# Patient Record
Sex: Female | Born: 1995 | Race: White | Hispanic: No | Marital: Single | Smoking: Never smoker
Health system: Southern US, Community
[De-identification: ages and names within clinical notes are randomized; demographics above are authoritative.]

## PROBLEM LIST (undated history)

## (undated) DIAGNOSIS — Z8041 Family history of malignant neoplasm of ovary: Secondary | ICD-10-CM

## (undated) DIAGNOSIS — Z1371 Encounter for nonprocreative screening for genetic disease carrier status: Secondary | ICD-10-CM

## (undated) DIAGNOSIS — Z23 Encounter for immunization: Secondary | ICD-10-CM

## (undated) HISTORY — PX: COLONOSCOPY: SHX174

## (undated) HISTORY — PX: WISDOM TOOTH EXTRACTION: SHX21

## (undated) HISTORY — DX: Family history of malignant neoplasm of ovary: Z80.41

## (undated) HISTORY — DX: Encounter for nonprocreative screening for genetic disease carrier status: Z13.71

## (undated) HISTORY — DX: Encounter for immunization: Z23

---

## 2016-12-20 ENCOUNTER — Ambulatory Visit: Payer: Self-pay | Admitting: Obstetrics and Gynecology

## 2017-02-21 ENCOUNTER — Encounter: Payer: Self-pay | Admitting: Obstetrics and Gynecology

## 2017-02-21 ENCOUNTER — Ambulatory Visit (INDEPENDENT_AMBULATORY_CARE_PROVIDER_SITE_OTHER): Payer: 59 | Admitting: Obstetrics and Gynecology

## 2017-02-21 ENCOUNTER — Other Ambulatory Visit: Payer: Self-pay

## 2017-02-21 VITALS — BP 138/78 | HR 76 | Ht 71.0 in | Wt 309.0 lb

## 2017-02-21 DIAGNOSIS — Z01419 Encounter for gynecological examination (general) (routine) without abnormal findings: Secondary | ICD-10-CM | POA: Diagnosis not present

## 2017-02-21 DIAGNOSIS — Z124 Encounter for screening for malignant neoplasm of cervix: Secondary | ICD-10-CM | POA: Diagnosis not present

## 2017-02-21 DIAGNOSIS — Z803 Family history of malignant neoplasm of breast: Secondary | ICD-10-CM

## 2017-02-21 DIAGNOSIS — Z3041 Encounter for surveillance of contraceptive pills: Secondary | ICD-10-CM | POA: Diagnosis not present

## 2017-02-21 DIAGNOSIS — Z113 Encounter for screening for infections with a predominantly sexual mode of transmission: Secondary | ICD-10-CM | POA: Diagnosis not present

## 2017-02-21 DIAGNOSIS — Z8041 Family history of malignant neoplasm of ovary: Secondary | ICD-10-CM | POA: Diagnosis not present

## 2017-02-21 MED ORDER — NORETHIN ACE-ETH ESTRAD-FE 1-20 MG-MCG PO TABS: 1.0000 | ORAL_TABLET | Freq: Every day | ORAL | 11 refills | Status: DC

## 2017-02-21 NOTE — Patient Instructions (Signed)
I value your feedback and entrusting us with your care. If you get a [facility] patient survey, I would appreciate you taking the time to let us know about your experience today. Thank you! 

## 2017-02-21 NOTE — Progress Notes (Signed)
PCP:  Patient, No Pcp Per   Chief Complaint  Patient presents with  . Gynecologic Exam    OCP not working  . Genetic Evaluation    Myriad Testing     HPI:      [name] is a 21 y.o. No obstetric history on file. who LMP was Patient's last menstrual period was 02/14/2017., presents today for her annual examination.  Her menses are regular every 28-30 days, lasting 4 days.  Dysmenorrhea moderate, occurring first 1-2 days of flow. She does not have intermenstrual bleeding. She stopped aviane OCPs 8/18 since not sexually active. She wasn't having periods on OCPs but they resumed normally after stopping them. She would like to restart a different pill for dysmen improvement.   Sex activity: not sexually active.  Hx of STDs: none  There is a FH of breast cancer and ovarian cancer in her PGM, genetic testing hasn't been done.  The patient does do self-breast exams. Pt would like MyRisk testing today.  Tobacco use: The patient denies current or previous tobacco use. Alcohol use: none No drug use.  Exercise: moderately active  She does get adequate calcium but not Vitamin D in her diet.  Gardasil vaccine completed.   Past Medical History:  Diagnosis Date  . Vaccine for human papilloma virus (HPV) types 6, 11, 16, and 18 administered     History reviewed. No pertinent surgical history.  Family History  Problem Relation Age of Onset  . Breast cancer Paternal Grandmother 97  . Ovarian cancer Paternal Grandmother 39    Social History   Socioeconomic History  . Marital status: Single    Spouse name: Not on file  . Number of children: Not on file  . Years of education: Not on file  . Highest education level: Not on file  Social Needs  . Financial resource strain: Not on file  . Food insecurity - worry: Not on file  . Food insecurity - inability: Not on file  . Transportation needs - medical: Not on file  . Transportation needs - non-medical: Not on file    Occupational History  . Occupation: Medical illustrator  Tobacco Use  . Smoking status: Never Smoker  . Smokeless tobacco: Never Used  Substance and Sexual Activity  . Alcohol use: No    Frequency: Never  . Drug use: No  . Sexual activity: Not Currently    Birth control/protection: OCP  Other Topics Concern  . Not on file  Social History Narrative  . Not on file    No outpatient medications have been marked as taking for the 02/21/17 encounter (Office Visit) with Kadeem Hyle, Deirdre Evener, PA-C.     ROS:  Review of Systems  Constitutional: Negative for fatigue, fever and unexpected weight change.  Respiratory: Negative for cough, shortness of breath and wheezing.   Cardiovascular: Negative for chest pain, palpitations and leg swelling.  Gastrointestinal: Negative for blood in stool, constipation, diarrhea, nausea and vomiting.  Endocrine: Negative for cold intolerance, heat intolerance and polyuria.  Genitourinary: Negative for dyspareunia, dysuria, flank pain, frequency, genital sores, hematuria, menstrual problem, pelvic pain, urgency, vaginal bleeding, vaginal discharge and vaginal pain.  Musculoskeletal: Negative for back pain, joint swelling and myalgias.  Skin: Negative for rash.  Neurological: Negative for dizziness, syncope, light-headedness, numbness and headaches.  Hematological: Negative for adenopathy.  Psychiatric/Behavioral: Negative for agitation, confusion, sleep disturbance and suicidal ideas. The patient is not nervous/anxious.      Objective: BP 138/78 (BP Location:  Left Arm, Patient Position: Sitting, Cuff Size: Normal)   Pulse 76   Ht 5' 11"  (1.803 m) Comment: 71"  Wt (!) 309 lb (140.2 kg)   LMP 02/14/2017   BMI 43.10 kg/m    Physical Exam  Constitutional: She is oriented to person, place, and time. She appears well-developed and well-nourished.  Genitourinary: Vagina normal and uterus normal. There is no rash or tenderness on the right labia. There is no  rash or tenderness on the left labia. No erythema or tenderness in the vagina. No vaginal discharge found. Right adnexum does not display mass and does not display tenderness. Left adnexum does not display mass and does not display tenderness. Cervix does not exhibit motion tenderness or polyp. Uterus is not enlarged or tender.  Neck: Normal range of motion. No thyromegaly present.  Cardiovascular: Normal rate, regular rhythm and normal heart sounds.  No murmur heard. Pulmonary/Chest: Effort normal and breath sounds normal. Right breast exhibits no mass, no nipple discharge, no skin change and no tenderness. Left breast exhibits no mass, no nipple discharge, no skin change and no tenderness.  Abdominal: Soft. There is no tenderness. There is no guarding.  Musculoskeletal: Normal range of motion.  Neurological: She is alert and oriented to person, place, and time. No cranial nerve deficit.  Psychiatric: She has a normal mood and affect. Her behavior is normal.  Vitals reviewed.   Assessment/Plan: Encounter for annual routine gynecological examination  Cervical cancer screening - Plan: IGP,CtNgTv,rfx Aptima HPV ASCU  Screening for STD (sexually transmitted disease) - Plan: IGP,CtNgTv,rfx Aptima HPV ASCU  Encounter for surveillance of contraceptive pills - OCP start. Try a different pill to see if has withdrawal bleed monthly. F/u prn. Rx junel 1/20. - Plan: norethindrone-ethinyl estradiol (JUNEL FE 1/20) 1-20 MG-MCG tablet  Family history of breast cancer - Plan: VITAMIN D 25 Hydroxy (Vit-D Deficiency, Fractures), Integrated BRACAnalysis  Family history of ovarian cancer - MyRisk testing discussed and done today. Will call pt with results given age. Handout given.  - Plan: Integrated BRACAnalysis  Meds ordered this encounter  Medications  . norethindrone-ethinyl estradiol (JUNEL FE 1/20) 1-20 MG-MCG tablet    Sig: Take 1 tablet daily by mouth.    Dispense:  1 Package    Refill:  11              GYN counsel adequate intake of calcium and vitamin D, diet and exercise     F/U  Return in about 1 year (around 02/21/2018).  Diandre Merica B. Fadia Marlar, PA-C 02/21/2017 3:23 PM

## 2017-02-22 LAB — VITAMIN D 25 HYDROXY (VIT D DEFICIENCY, FRACTURES): Vit D, 25-Hydroxy: 30.1 ng/mL (ref 30.0–100.0)

## 2017-02-23 LAB — IGP,CTNGTV,RFX APTIMA HPV ASCU
Chlamydia, Nuc. Acid Amp: NEGATIVE
Gonococcus, Nuc. Acid Amp: NEGATIVE
PAP Smear Comment: 0
Trich vag by NAA: NEGATIVE

## 2017-03-05 DIAGNOSIS — Z1371 Encounter for nonprocreative screening for genetic disease carrier status: Secondary | ICD-10-CM

## 2017-03-05 HISTORY — DX: Encounter for nonprocreative screening for genetic disease carrier status: Z13.71

## 2017-03-10 ENCOUNTER — Encounter: Payer: Self-pay | Admitting: Obstetrics and Gynecology

## 2017-03-23 ENCOUNTER — Telehealth: Payer: Self-pay | Admitting: Obstetrics and Gynecology

## 2017-03-23 NOTE — Telephone Encounter (Signed)
Pt aware of neg MyRisk results. Discussed them with Stefanie Libel, GC per her [employer] ins. Nothing further to do at this time. Questions answered.

## 2017-06-14 DIAGNOSIS — M5432 Sciatica, left side: Secondary | ICD-10-CM | POA: Diagnosis not present

## 2017-06-14 DIAGNOSIS — S39012A Strain of muscle, fascia and tendon of lower back, initial encounter: Secondary | ICD-10-CM | POA: Diagnosis not present

## 2017-08-16 ENCOUNTER — Telehealth: Payer: Self-pay | Admitting: Obstetrics and Gynecology

## 2017-08-16 NOTE — Telephone Encounter (Signed)
Patient is calling for results for Myriad. Insurance states they will not pay for her follow up appointment,ent and patient wishes to speak with Elmo Putt. Please advise

## 2017-08-17 NOTE — Telephone Encounter (Signed)
Pt received letter from [facility] stating it wasn't covered. I received same letter and have already spoken to Mikal Plane at [facility]. She states it was originally approved 11/18 so we're not sure why case was retroactively denied. Pt clearly meets guidelines and followed [employer] protocol of talking with GC. Leda Gauze will let me know if anything else needs to be done, but pt won't receive a bill regardless. Reassured pt. F/u prn.

## 2018-02-22 ENCOUNTER — Encounter: Payer: Self-pay | Admitting: Obstetrics and Gynecology

## 2018-02-22 ENCOUNTER — Ambulatory Visit (INDEPENDENT_AMBULATORY_CARE_PROVIDER_SITE_OTHER): Payer: Commercial Managed Care - PPO | Admitting: Obstetrics and Gynecology

## 2018-02-22 ENCOUNTER — Other Ambulatory Visit (HOSPITAL_COMMUNITY)
Admission: RE | Admit: 2018-02-22 | Discharge: 2018-02-22 | Disposition: A | Discharge status: Home / Self Care | Payer: Commercial Managed Care - PPO | Source: Ambulatory Visit | Attending: Obstetrics and Gynecology | Admitting: Obstetrics and Gynecology

## 2018-02-22 VITALS — BP 140/90 | HR 113 | Ht 71.0 in | Wt 308.0 lb

## 2018-02-22 DIAGNOSIS — Z30011 Encounter for initial prescription of contraceptive pills: Secondary | ICD-10-CM

## 2018-02-22 DIAGNOSIS — Z124 Encounter for screening for malignant neoplasm of cervix: Secondary | ICD-10-CM | POA: Insufficient documentation

## 2018-02-22 DIAGNOSIS — Z01419 Encounter for gynecological examination (general) (routine) without abnormal findings: Secondary | ICD-10-CM | POA: Diagnosis not present

## 2018-02-22 MED ORDER — DROSPIRENONE-ETHINYL ESTRADIOL 3-0.02 MG PO TABS: 1.0000 | ORAL_TABLET | Freq: Every day | ORAL | 3 refills | Status: DC

## 2018-02-22 NOTE — Patient Instructions (Signed)
I value your feedback and entrusting us with your care. If you get a Caitlin Stein patient survey, I would appreciate you taking the time to let us know about your experience today. Thank you! 

## 2018-02-22 NOTE — Progress Notes (Signed)
PCP:  Patient, No Pcp Per   Chief Complaint  Patient presents with  . Gynecologic Exam    pt states she needs to swith [facility]     HPI:      [name] is a 22 y.o. No obstetric history on file. who LMP was Patient's last menstrual period was 02/05/2018 (approximate)., presents today for her annual examination.  Her menses are regular every 28-30 days, lasting 4 days.  Dysmenorrhea moderate, occurring first 1-2 days of flow. She does not have intermenstrual bleeding. She stopped aviane OCPs 8/18 since not sexually active. She wasn't having periods on OCPs but they resumed normally after stopping them. She wanted to restart a different pill for dysmen improvement last yr. Tried junel 1/20 but had dry heaving every morning. Would like a different pill again.    Sex activity: not sexually active.  Hx of STDs: none  There is a FH of breast cancer and ovarian cancer in her PGM, genetic testing has been done. Pt is MyRisk neg 2018. IBIS=19%/riskscore =10%. The patient does do self-breast exams.   Tobacco use: The patient denies current or previous tobacco use. Alcohol use: none No drug use.  Exercise: moderately active  She does get adequate calcium but not Vitamin D in her diet.  Gardasil vaccine completed.   Past Medical History:  Diagnosis Date  . BRCA gene mutation negative 03/2017   MyRisk neg; IBIS=19%/riskscore=10%  . Family history of ovarian cancer    MyRisk neg; no further screening recommendations  . Vaccine for human papilloma virus (HPV) types 6, 11, 16, and 18 administered     History reviewed. No pertinent surgical history.  Family History  Problem Relation Age of Onset  . Breast cancer Paternal Grandmother 64  . Ovarian cancer Paternal Grandmother 23    Social History   Socioeconomic History  . Marital status: Single    Spouse name: Not on file  . Number of children: Not on file  . Years of education: Not on file  . Highest education level: Not on  file  Occupational History  . Occupation: Medical illustrator  Social Needs  . Financial resource strain: Not on file  . Food insecurity:    Worry: Not on file    Inability: Not on file  . Transportation needs:    Medical: Not on file    Non-medical: Not on file  Tobacco Use  . Smoking status: Never Smoker  . Smokeless tobacco: Never Used  Substance and Sexual Activity  . Alcohol use: No    Frequency: Never  . Drug use: No  . Sexual activity: Not Currently    Birth control/protection: None  Lifestyle  . Physical activity:    Days per week: 3 days    Minutes per session: 60 min  . Stress: Not on file  Relationships  . Social connections:    Talks on phone: More than three times a week    Gets together: More than three times a week    Attends religious service: 1 to 4 times per year    Active member of club or organization: Yes    Attends meetings of clubs or organizations: More than 4 times per year    Relationship status: Never married  . Intimate partner violence:    Fear of current or ex partner: Patient refused    Emotionally abused: Patient refused    Physically abused: Patient refused    Forced sexual activity: Patient refused  Other  Topics Concern  . Not on file  Social History Narrative  . Not on file    Current Meds  Medication Sig  . albuterol (PROAIR HFA) 108 (90 Base) MCG/ACT inhaler   . benzonatate (TESSALON) 100 MG capsule   . ipratropium (ATROVENT) 0.06 % nasal spray   . methylPREDNISolone (MEDROL DOSEPAK) 4 MG TBPK tablet   . nystatin cream (MYCOSTATIN) APPLY TO AFFECTED AREA 3 TIMES A DAY     ROS:  Review of Systems  Constitutional: Negative for fatigue, fever and unexpected weight change.  Respiratory: Negative for cough, shortness of breath and wheezing.   Cardiovascular: Negative for chest pain, palpitations and leg swelling.  Gastrointestinal: Negative for blood in stool, constipation, diarrhea, nausea and vomiting.  Endocrine: Negative for  cold intolerance, heat intolerance and polyuria.  Genitourinary: Negative for dyspareunia, dysuria, flank pain, frequency, genital sores, hematuria, menstrual problem, pelvic pain, urgency, vaginal bleeding, vaginal discharge and vaginal pain.  Musculoskeletal: Negative for back pain, joint swelling and myalgias.  Skin: Negative for rash.  Neurological: Negative for dizziness, syncope, light-headedness, numbness and headaches.  Hematological: Negative for adenopathy.  Psychiatric/Behavioral: Negative for agitation, confusion, sleep disturbance and suicidal ideas. The patient is not nervous/anxious.      Objective: BP 140/90   Pulse (!) 113   Ht 5' 11"  (1.803 m)   Wt (!) 308 lb (139.7 kg)   LMP 02/05/2018 (Approximate)   BMI 42.96 kg/m    Physical Exam  Constitutional: She is oriented to person, place, and time. She appears well-developed and well-nourished.  Genitourinary: Vagina normal and uterus normal. There is no rash or tenderness on the right labia. There is no rash or tenderness on the left labia. No erythema or tenderness in the vagina. No vaginal discharge found. Right adnexum does not display mass and does not display tenderness. Left adnexum does not display mass and does not display tenderness. Cervix does not exhibit motion tenderness or polyp. Uterus is not enlarged or tender.  Neck: Normal range of motion. No thyromegaly present.  Cardiovascular: Normal rate, regular rhythm and normal heart sounds.  No murmur heard. Pulmonary/Chest: Effort normal and breath sounds normal. Right breast exhibits no mass, no nipple discharge, no skin change and no tenderness. Left breast exhibits no mass, no nipple discharge, no skin change and no tenderness.  Abdominal: Soft. There is no tenderness. There is no guarding.  Musculoskeletal: Normal range of motion.  Neurological: She is alert and oriented to person, place, and time. No cranial nerve deficit.  Psychiatric: She has a normal mood  and affect. Her behavior is normal.  Vitals reviewed.   Assessment/Plan: Encounter for annual routine gynecological examination  Cervical cancer screening - Plan: Cytology - PAP  Encounter for initial prescription of contraceptive pills - OCP restart with next menses.  - Plan: drospirenone-ethinyl estradiol (YAZ) 3-0.02 MG tablet  Meds ordered this encounter  Medications  . drospirenone-ethinyl estradiol (YAZ) 3-0.02 MG tablet    Sig: Take 1 tablet by mouth daily.    Dispense:  3 Package    Refill:  3    Order Specific Question:   Supervising Provider    Answer:   Gae Dry [[id number]]             GYN counsel adequate intake of calcium and vitamin D, diet and exercise     F/U  Return in about 1 year (around 02/23/2019).  Laurette Villescas B. Kindred Heying, PA-C 02/22/2018 1:52 PM

## 2018-02-23 DIAGNOSIS — R509 Fever, unspecified: Secondary | ICD-10-CM | POA: Diagnosis not present

## 2018-02-23 DIAGNOSIS — R0602 Shortness of breath: Secondary | ICD-10-CM | POA: Diagnosis not present

## 2018-02-23 DIAGNOSIS — R079 Chest pain, unspecified: Secondary | ICD-10-CM | POA: Diagnosis not present

## 2018-02-23 DIAGNOSIS — R05 Cough: Secondary | ICD-10-CM | POA: Diagnosis not present

## 2018-02-24 LAB — CYTOLOGY - PAP: Diagnosis: NEGATIVE

## 2018-05-08 DIAGNOSIS — D2239 Melanocytic nevi of other parts of face: Secondary | ICD-10-CM | POA: Diagnosis not present

## 2018-05-08 DIAGNOSIS — L408 Other psoriasis: Secondary | ICD-10-CM | POA: Diagnosis not present

## 2018-05-08 DIAGNOSIS — L700 Acne vulgaris: Secondary | ICD-10-CM | POA: Diagnosis not present

## 2019-03-05 NOTE — Progress Notes (Signed)
PCP:  Patient, No Pcp Per   Chief Complaint  Patient presents with  . Gynecologic Exam    flu shot    HPI:      [name] is a 23 y.o. No obstetric history on file. who LMP was Patient's last menstrual period was 09/04/2018 (approximate)., presents today for her annual examination.  Her menses are absent with cont dosing of OCPs (Yaz), never taking placebo pills. No dysmen, BTB.  Sex activity: currently sexually active--contraception OCPs..  Last pap: 02/22/18 Results: no abnormalities Hx of STDs: none  There is a FH of breast cancer and ovarian cancer in her PGM, genetic testing has been done. Pt is MyRisk neg 2018. IBIS=19%/riskscore =10%. The patient does do self-breast exams.   Tobacco use: The patient denies current or previous tobacco use. Alcohol use: none No drug use.  Exercise: moderately active  She does get adequate calcium but not Vitamin D in her diet.  Gardasil vaccine completed.   Past Medical History:  Diagnosis Date  . BRCA gene mutation negative 03/2017   MyRisk neg; IBIS=19%/riskscore=10%  . Family history of ovarian cancer    MyRisk neg; no further screening recommendations  . Vaccine for human papilloma virus (HPV) types 6, 11, 16, and 18 administered     History reviewed. No pertinent surgical history.  Family History  Problem Relation Age of Onset  . Breast cancer Paternal Grandmother 57  . Ovarian cancer Paternal Grandmother 85    Social History   Socioeconomic History  . Marital status: Single    Spouse name: Not on file  . Number of children: Not on file  . Years of education: Not on file  . Highest education level: Not on file  Occupational History  . Occupation: Medical illustrator  Social Needs  . Financial resource strain: Not on file  . Food insecurity    Worry: Not on file    Inability: Not on file  . Transportation needs    Medical: Not on file    Non-medical: Not on file  Tobacco Use  . Smoking status: Never  Smoker  . Smokeless tobacco: Never Used  Substance and Sexual Activity  . Alcohol use: No    Frequency: Never  . Drug use: No  . Sexual activity: Yes    Birth control/protection: Pill  Lifestyle  . Physical activity    Days per week: 3 days    Minutes per session: 60 min  . Stress: Not on file  Relationships  . Social connections    Talks on phone: More than three times a week    Gets together: More than three times a week    Attends religious service: 1 to 4 times per year    Active member of club or organization: Yes    Attends meetings of clubs or organizations: More than 4 times per year    Relationship status: Never married  . Intimate partner violence    Fear of current or ex partner: Patient refused    Emotionally abused: Patient refused    Physically abused: Patient refused    Forced sexual activity: Patient refused  Other Topics Concern  . Not on file  Social History Narrative  . Not on file    Current Meds  Medication Sig  . albuterol (PROAIR HFA) 108 (90 Base) MCG/ACT inhaler   . clindamycin (CLEOCIN T) 1 % lotion Apply topically.  . clobetasol (TEMOVATE) 0.05 % external solution Apply topically.  . drospirenone-ethinyl estradiol (  YAZ) 3-0.02 MG tablet Take 1 tablet by mouth daily.  Marland Kitchen ketoconazole (NIZORAL) 2 % shampoo Apply topically.  . tretinoin (RETIN-A) 0.05 % cream Apply topically.  . [DISCONTINUED] drospirenone-ethinyl estradiol (YAZ) 3-0.02 MG tablet Take 1 tablet by mouth daily.  . [DISCONTINUED] drospirenone-ethinyl estradiol (YAZ) 3-0.02 MG tablet Take 1 tablet by mouth daily.     ROS:  Review of Systems  Constitutional: Negative for fatigue, fever and unexpected weight change.  Respiratory: Negative for cough, shortness of breath and wheezing.   Cardiovascular: Negative for chest pain, palpitations and leg swelling.  Gastrointestinal: Negative for blood in stool, constipation, diarrhea, nausea and vomiting.  Endocrine: Negative for cold  intolerance, heat intolerance and polyuria.  Genitourinary: Negative for dyspareunia, dysuria, flank pain, frequency, genital sores, hematuria, menstrual problem, pelvic pain, urgency, vaginal bleeding, vaginal discharge and vaginal pain.  Musculoskeletal: Negative for back pain, joint swelling and myalgias.  Skin: Negative for rash.  Neurological: Negative for dizziness, syncope, light-headedness, numbness and headaches.  Hematological: Negative for adenopathy.  Psychiatric/Behavioral: Negative for agitation, confusion, sleep disturbance and suicidal ideas. The patient is not nervous/anxious.      Objective: BP 118/80   Ht _0  (1.803 m)   Wt 287 lb (130.2 kg)   LMP 09/04/2018 (Approximate)   BMI 40.03 kg/m    Physical Exam Constitutional:      Appearance: She is well-developed.  Genitourinary:     Vulva, vagina, uterus, right adnexa and left adnexa normal.     No vulval lesion or tenderness noted.     No vaginal discharge, erythema or tenderness.     No cervical motion tenderness or polyp.     Uterus is not enlarged or tender.     No right or left adnexal mass present.     Right adnexa not tender.     Left adnexa not tender.  Neck:     Musculoskeletal: Normal range of motion.     Thyroid: No thyromegaly.  Cardiovascular:     Rate and Rhythm: Normal rate and regular rhythm.     Heart sounds: Normal heart sounds. No murmur.  Pulmonary:     Effort: Pulmonary effort is normal.     Breath sounds: Normal breath sounds.  Chest:     Breasts:        Right: No mass, nipple discharge, skin change or tenderness.        Left: No mass, nipple discharge, skin change or tenderness.  Abdominal:     Palpations: Abdomen is soft.     Tenderness: There is no abdominal tenderness. There is no guarding.  Musculoskeletal: Normal range of motion.  Neurological:     General: No focal deficit present.     Mental Status: She is alert and oriented to person, place, and time.     Cranial  Nerves: No cranial nerve deficit.  Skin:    General: Skin is warm and dry.  Psychiatric:        Mood and Affect: Mood normal.        Behavior: Behavior normal.        Thought Content: Thought content normal.        Judgment: Judgment normal.  Vitals signs reviewed.     Assessment/Plan: Encounter for annual routine gynecological examination  Screening for STD (sexually transmitted disease) - Plan: Avenel STD  Encounter for initial prescription of contraceptive pills - OCP RF.  - Plan: drospirenone-ethinyl estradiol (YAZ) 3-0.02 MG tablet, DISCONTINUED: drospirenone-ethinyl estradiol (YAZ) 3-0.02 MG tablet  Meds ordered this encounter  Medications  . DISCONTD: drospirenone-ethinyl estradiol (YAZ) 3-0.02 MG tablet    Sig: Take 1 tablet by mouth daily.    Dispense:  3 Package    Refill:  3    Order Specific Question:   Supervising Provider    Answer:   Gae Dry [id number]  . drospirenone-ethinyl estradiol (YAZ) 3-0.02 MG tablet    Sig: Take 1 tablet by mouth daily.    Dispense:  3 Package    Refill:  4    Order Specific Question:   Supervising Provider    Answer:   Gae Dry [[id number]]             GYN counsel adequate intake of calcium and vitamin D, diet and exercise     F/U  Return in about 1 year (around 03/05/2020).  Altagracia Rone B. Andretta Ergle, PA-C 03/06/2019 8:41 AM

## 2019-03-06 ENCOUNTER — Encounter: Payer: Self-pay | Admitting: Obstetrics and Gynecology

## 2019-03-06 ENCOUNTER — Ambulatory Visit (INDEPENDENT_AMBULATORY_CARE_PROVIDER_SITE_OTHER): Payer: 59 | Admitting: Obstetrics and Gynecology

## 2019-03-06 ENCOUNTER — Other Ambulatory Visit: Payer: Self-pay

## 2019-03-06 ENCOUNTER — Other Ambulatory Visit (HOSPITAL_COMMUNITY)
Admission: RE | Admit: 2019-03-06 | Discharge: 2019-03-06 | Disposition: A | Discharge status: Home / Self Care | Payer: 59 | Source: Ambulatory Visit | Attending: Obstetrics and Gynecology | Admitting: Obstetrics and Gynecology

## 2019-03-06 VITALS — BP 118/80 | Ht 71.0 in | Wt 287.0 lb

## 2019-03-06 DIAGNOSIS — Z23 Encounter for immunization: Secondary | ICD-10-CM | POA: Diagnosis not present

## 2019-03-06 DIAGNOSIS — Z113 Encounter for screening for infections with a predominantly sexual mode of transmission: Secondary | ICD-10-CM | POA: Diagnosis not present

## 2019-03-06 DIAGNOSIS — Z01419 Encounter for gynecological examination (general) (routine) without abnormal findings: Secondary | ICD-10-CM

## 2019-03-06 DIAGNOSIS — Z3041 Encounter for surveillance of contraceptive pills: Secondary | ICD-10-CM

## 2019-03-06 DIAGNOSIS — Z30011 Encounter for initial prescription of contraceptive pills: Secondary | ICD-10-CM

## 2019-03-06 MED ORDER — DROSPIRENONE-ETHINYL ESTRADIOL 3-0.02 MG PO TABS: 1.0000 | ORAL_TABLET | Freq: Every day | ORAL | 4 refills | Status: DC

## 2019-03-06 MED ORDER — DROSPIRENONE-ETHINYL ESTRADIOL 3-0.02 MG PO TABS: 1.0000 | ORAL_TABLET | Freq: Every day | ORAL | 3 refills | Status: DC

## 2019-03-06 NOTE — Patient Instructions (Signed)
I value your feedback and entrusting us with your care. If you get a [facility] patient survey, I would appreciate you taking the time to let us know about your experience today. Thank you! 

## 2019-03-06 NOTE — Addendum Note (Signed)
Addended by: Drenda Freeze on: 03/06/2019 09:03 AM   Modules accepted: Orders

## 2019-03-08 LAB — CERVICOVAGINAL ANCILLARY ONLY
Chlamydia: NEGATIVE
Comment: NEGATIVE
Comment: NORMAL
Neisseria Gonorrhea: NEGATIVE

## 2019-04-02 ENCOUNTER — Other Ambulatory Visit: Payer: Self-pay

## 2019-04-02 ENCOUNTER — Ambulatory Visit: Payer: 59 | Attending: Internal Medicine

## 2019-04-02 DIAGNOSIS — Z20822 Contact with and (suspected) exposure to covid-19: Secondary | ICD-10-CM

## 2019-04-03 LAB — NOVEL CORONAVIRUS, NAA: SARS-CoV-2, NAA: DETECTED — AB

## 2019-04-04 ENCOUNTER — Encounter: Payer: Self-pay | Admitting: Infectious Diseases

## 2019-04-06 ENCOUNTER — Emergency Department
Admission: EM | Admit: 2019-04-06 | Discharge: 2019-04-06 | Disposition: A | Discharge status: Home / Self Care | Payer: 59 | Attending: Emergency Medicine | Admitting: Emergency Medicine

## 2019-04-06 ENCOUNTER — Other Ambulatory Visit: Payer: Self-pay

## 2019-04-06 ENCOUNTER — Emergency Department: Payer: 59

## 2019-04-06 DIAGNOSIS — E860 Dehydration: Secondary | ICD-10-CM | POA: Diagnosis not present

## 2019-04-06 DIAGNOSIS — R0789 Other chest pain: Secondary | ICD-10-CM | POA: Diagnosis present

## 2019-04-06 DIAGNOSIS — R55 Syncope and collapse: Secondary | ICD-10-CM

## 2019-04-06 DIAGNOSIS — R531 Weakness: Secondary | ICD-10-CM | POA: Diagnosis not present

## 2019-04-06 DIAGNOSIS — U071 COVID-19: Secondary | ICD-10-CM | POA: Diagnosis not present

## 2019-04-06 LAB — CBC WITH DIFFERENTIAL/PLATELET
Abs Immature Granulocytes: 0 10*3/uL (ref 0.00–0.07)
Basophils Absolute: 0 10*3/uL (ref 0.0–0.1)
Basophils Relative: 1 %
Eosinophils Absolute: 0.1 10*3/uL (ref 0.0–0.5)
Eosinophils Relative: 1 %
HCT: 42.7 % (ref 36.0–46.0)
Hemoglobin: 14.5 g/dL (ref 12.0–15.0)
Immature Granulocytes: 0 %
Lymphocytes Relative: 53 %
Lymphs Abs: 4.1 10*3/uL — ABNORMAL HIGH (ref 0.7–4.0)
MCH: 29.7 pg (ref 26.0–34.0)
MCHC: 34 g/dL (ref 30.0–36.0)
MCV: 87.3 fL (ref 80.0–100.0)
Monocytes Absolute: 0.5 10*3/uL (ref 0.1–1.0)
Monocytes Relative: 7 %
Neutro Abs: 2.9 10*3/uL (ref 1.7–7.7)
Neutrophils Relative %: 38 %
Platelets: 270 10*3/uL (ref 150–400)
RBC: 4.89 MIL/uL (ref 3.87–5.11)
RDW: 11.8 % (ref 11.5–15.5)
WBC: 7.5 10*3/uL (ref 4.0–10.5)
nRBC: 0 % (ref 0.0–0.2)

## 2019-04-06 LAB — COMPREHENSIVE METABOLIC PANEL
ALT: 94 U/L — ABNORMAL HIGH (ref 0–44)
AST: 56 U/L — ABNORMAL HIGH (ref 15–41)
Albumin: 4.2 g/dL (ref 3.5–5.0)
Alkaline Phosphatase: 72 U/L (ref 38–126)
Anion gap: 10 (ref 5–15)
BUN: 21 mg/dL — ABNORMAL HIGH (ref 6–20)
CO2: 21 mmol/L — ABNORMAL LOW (ref 22–32)
Calcium: 9.3 mg/dL (ref 8.9–10.3)
Chloride: 107 mmol/L (ref 98–111)
Creatinine, Ser: 1.03 mg/dL — ABNORMAL HIGH (ref 0.44–1.00)
GFR calc Af Amer: >60 mL/min (ref >60)
GFR calc non Af Amer: >60 mL/min (ref >60)
Glucose, Bld: 107 mg/dL — ABNORMAL HIGH (ref 70–99)
Potassium: 3.8 mmol/L (ref 3.5–5.1)
Sodium: 138 mmol/L (ref 135–145)
Total Bilirubin: 1.1 mg/dL (ref 0.3–1.2)
Total Protein: 8.6 g/dL — ABNORMAL HIGH (ref 6.5–8.1)

## 2019-04-06 LAB — TROPONIN I (HIGH SENSITIVITY): Troponin I (High Sensitivity): <2 ng/L (ref <18)

## 2019-04-06 MED ORDER — SODIUM CHLORIDE 0.9% FLUSH
3.0000 mL | Freq: Once | INTRAVENOUS | Status: AC
  Administered 2019-04-06: 3 mL via INTRAVENOUS

## 2019-04-06 MED ORDER — SODIUM CHLORIDE 0.9 % IV SOLN
Freq: Once | INTRAVENOUS | Status: AC
  Administered 2019-04-06: 1000 mL via INTRAVENOUS

## 2019-04-06 NOTE — ED Notes (Signed)
First Nurse Note: Pt states that she has COVID. Pt is having central chest tightness and also thinks she is dehydrated. Pt is in NAD.

## 2019-04-06 NOTE — ED Provider Notes (Addendum)
[facility] Emergency Department Provider Note       Time seen: ----------------------------------------- 9:50 AM on 04/06/2019 -----------------------------------------   I have reviewed the triage vital signs and the nursing notes.  HISTORY   Chief Complaint Chest Pain    HPI Caitlin Stein is a 24 y.o. female with no known past medical history who presents to the ED for chest tightness, feeling weak and dehydrated.  Patient has felt near syncopal, does not have any palpitations.  Discomfort is 4 out of 10 in the mid chest that she describes as a tightness.  Past Medical History:  Diagnosis Date  . BRCA gene mutation negative 03/2017   MyRisk neg; IBIS=19%/riskscore=10%  . Family history of ovarian cancer    MyRisk neg; no further screening recommendations  . Vaccine for human papilloma virus (HPV) types 6, 11, 16, and 18 administered     There are no problems to display for this patient.   History reviewed. No pertinent surgical history.  Allergies Codeine  Social History Social History   Tobacco Use  . Smoking status: Never Smoker  . Smokeless tobacco: Never Used  Substance Use Topics  . Alcohol use: No  . Drug use: No   Review of Systems Constitutional: Negative for fever. Cardiovascular: Positive for chest tightness Respiratory: Negative for shortness of breath. Gastrointestinal: Negative for abdominal pain, vomiting and diarrhea. Musculoskeletal: Negative for back pain. Skin: Negative for rash. Neurological: Negative for headaches, focal weakness or numbness.  All systems negative/normal/unremarkable except as stated in the HPI  ____________________________________________   PHYSICAL EXAM:  VITAL SIGNS: ED Triage Vitals  Enc Vitals Group     BP 04/06/19 0936 139/85     Pulse Rate 04/06/19 0936 88     Resp 04/06/19 0936 17     Temp 04/06/19 0936 98.8 F (37.1 C)     Temp Source 04/06/19 0936 Oral     SpO2 04/06/19  0936 97 %     Weight 04/06/19 0932 280 lb (127 kg)     Height 04/06/19 0932 5' 11"  (1.803 m)     Head Circumference --      Peak Flow --      Pain Score 04/06/19 0932 4     Pain Loc --      Pain Edu? --      Excl. in [place]? --    Constitutional: Alert and oriented. Well appearing and in no distress. Eyes: Conjunctivae are normal. Normal extraocular movements. ENT      Head: Normocephalic and atraumatic.      Nose: No congestion/rhinnorhea.      Mouth/Throat: Mucous membranes are moist.      Neck: No stridor. Cardiovascular: Normal rate, regular rhythm. No murmurs, rubs, or gallops. Respiratory: Normal respiratory effort without tachypnea nor retractions. Breath sounds are clear and equal bilaterally. No wheezes/rales/rhonchi. Gastrointestinal: Soft and nontender. Normal bowel sounds Musculoskeletal: Nontender with normal range of motion in extremities. No lower extremity tenderness nor edema. Neurologic:  Normal speech and language. No gross focal neurologic deficits are appreciated.  Skin:  Skin is warm, dry and intact. No rash noted. Psychiatric: Mood and affect are normal. Speech and behavior are normal.  ____________________________________________  EKG: Interpreted by me.  Sinus rhythm with first-degree AV block, rate is 110 bpm, normal QRS, normal axis, normal QT  Repeat EKG, interpreted by me, normal sinus rhythm with rate of 60 bpm, normal PR interval, normal QRS, normal QT ____________________________________________  ED COURSE:  As part of  my medical decision making, I reviewed the following data within the [place] History obtained from family if available, nursing notes, old chart and ekg, as well as notes from prior ED visits. Patient presented for near syncope, we will assess with labs and imaging as indicated at this time.   Procedures  Keundra Petrucelli was evaluated in Emergency Department on 04/06/2019 for the symptoms described in the history of  present illness. She was evaluated in the context of the global COVID-19 pandemic, which necessitated consideration that the patient might be at risk for infection with the SARS-CoV-2 virus that causes COVID-19. Institutional protocols and algorithms that pertain to the evaluation of patients at risk for COVID-19 are in a state of rapid change based on information released by regulatory bodies including the CDC and federal and state organizations. These policies and algorithms were followed during the patient's care in the ED.  ____________________________________________   LABS (pertinent positives/negatives)  Labs Reviewed  CBC WITH DIFFERENTIAL/PLATELET - Abnormal; Notable for the following components:      Result Value   Lymphs Abs 4.1 (*)    All other components within normal limits  COMPREHENSIVE METABOLIC PANEL - Abnormal; Notable for the following components:   CO2 21 (*)    Glucose, Bld 107 (*)    BUN 21 (*)    Creatinine, Ser 1.03 (*)    Total Protein 8.6 (*)    AST 56 (*)    ALT 94 (*)    All other components within normal limits  POC URINE PREG, ED  TROPONIN I (HIGH SENSITIVITY)    RADIOLOGY Images were viewed by me  Chest x-ray is unremarkable  ____________________________________________   DIFFERENTIAL DIAGNOSIS   COVID-19, dehydration, electrolyte abnormality, arrhythmia  FINAL ASSESSMENT AND PLAN  COVID-19, near syncope   Plan: The patient had presented for COVID-19 and near syncope. Patient's labs were reassuring. Patient's imaging did not reveal any acute process. Her work-up here has been negative but she was orthostatic for which she received IV fluids.  I will advise outpatient follow-up with cardiology once she is clinically not contagious.   Laurence Aly, MD    Note: This note was generated in part or whole with voice recognition software. Voice recognition is usually quite accurate but there are transcription errors that can and very often  do occur. I apologize for any typographical errors that were not detected and corrected.     Earleen Newport, MD 04/06/19 1017    Earleen Newport, MD 04/06/19 1032    Earleen Newport, MD 04/06/19 1059

## 2019-04-06 NOTE — ED Triage Notes (Signed)
Pt is COVID+ (tested Wed) Pt c/o chest tightness, feeling of passing out, and "dehydration"

## 2019-06-05 ENCOUNTER — Telehealth: Payer: Self-pay

## 2019-06-05 ENCOUNTER — Encounter: Payer: Self-pay | Admitting: Obstetrics and Gynecology

## 2019-06-05 NOTE — Telephone Encounter (Signed)
My chart message rcvd from patient stating PA needed for OCP. My chart msg fwd to [facility] CMA) to complete PA.

## 2019-06-05 NOTE — Telephone Encounter (Signed)
Patient requesting return call to discuss a rx that was denied by her insurance. [phone]

## 2019-06-05 NOTE — Telephone Encounter (Signed)
Pls call her pharm to see about PA. Thx.

## 2019-06-05 NOTE — Telephone Encounter (Signed)
Spoke w/patient. She states that her birth control is no longer covered. Advised to contact insurance or look on app/website to find out what is covered and contact us back either by phone or thru my chart.

## 2019-06-06 NOTE — Telephone Encounter (Signed)
Call phamacy, no PA needed. BC is covered by her insurance she just needed to pay a copay of $68 for a 3 mo supply. Or do the goodrx coupon and pay $30 for a 3 mo supply. Pt went ahead and used goodrx coupon yesterday and paid $30 but would like for a BC completely covered by insurance. Can you change BC?

## 2019-06-06 NOTE — Telephone Encounter (Signed)
She needs to check her insurance formulary to find out what is covered at $0. She also has not done well on several pills in past.

## 2019-06-06 NOTE — Telephone Encounter (Signed)
Called pt, left detailed msg.

## 2019-07-16 ENCOUNTER — Ambulatory Visit
Admission: RE | Admit: 2019-07-16 | Discharge: 2019-07-16 | Disposition: A | Discharge status: Home / Self Care | Payer: 59 | Source: Ambulatory Visit | Attending: Gastroenterology | Admitting: Gastroenterology

## 2019-07-16 ENCOUNTER — Other Ambulatory Visit: Payer: Self-pay | Admitting: Gastroenterology

## 2019-07-16 DIAGNOSIS — K5900 Constipation, unspecified: Secondary | ICD-10-CM | POA: Insufficient documentation

## 2019-08-28 ENCOUNTER — Other Ambulatory Visit: Payer: Self-pay | Admitting: Gastroenterology

## 2019-08-28 DIAGNOSIS — R1084 Generalized abdominal pain: Secondary | ICD-10-CM

## 2019-09-06 ENCOUNTER — Ambulatory Visit: Admission: RE | Admit: 2019-09-06 | Payer: 59 | Source: Ambulatory Visit

## 2020-03-06 ENCOUNTER — Other Ambulatory Visit (HOSPITAL_COMMUNITY)
Admission: RE | Admit: 2020-03-06 | Discharge: 2020-03-06 | Disposition: A | Discharge status: Home / Self Care | Payer: 59 | Source: Ambulatory Visit | Attending: Obstetrics and Gynecology | Admitting: Obstetrics and Gynecology

## 2020-03-06 ENCOUNTER — Ambulatory Visit (INDEPENDENT_AMBULATORY_CARE_PROVIDER_SITE_OTHER): Payer: 59 | Admitting: Obstetrics and Gynecology

## 2020-03-06 ENCOUNTER — Encounter: Payer: Self-pay | Admitting: Obstetrics and Gynecology

## 2020-03-06 ENCOUNTER — Other Ambulatory Visit: Payer: Self-pay

## 2020-03-06 VITALS — BP 132/80 | HR 64 | Resp 16 | Ht 71.0 in | Wt 286.4 lb

## 2020-03-06 DIAGNOSIS — Z113 Encounter for screening for infections with a predominantly sexual mode of transmission: Secondary | ICD-10-CM

## 2020-03-06 DIAGNOSIS — Z01419 Encounter for gynecological examination (general) (routine) without abnormal findings: Secondary | ICD-10-CM | POA: Diagnosis not present

## 2020-03-06 DIAGNOSIS — Z3041 Encounter for surveillance of contraceptive pills: Secondary | ICD-10-CM | POA: Diagnosis not present

## 2020-03-06 MED ORDER — DROSPIRENONE-ETHINYL ESTRADIOL 3-0.02 MG PO TABS: 1.0000 | ORAL_TABLET | Freq: Every day | ORAL | 4 refills | Status: DC

## 2020-03-06 NOTE — Patient Instructions (Signed)
I value your feedback and entrusting us with your care. If you get a [facility] patient survey, I would appreciate you taking the time to let us know about your experience today. Thank you!  As of March 15, 2019, your lab results will be released to your MyChart immediately, before I even have a chance to see them. Please give me time to review them and contact you if there are any abnormalities. Thank you for your patience.  

## 2020-03-06 NOTE — Progress Notes (Signed)
PCP:  Rusty Aus, MD   Chief Complaint  Patient presents with  . Gynecologic Exam    HPI:      [name] is a 24 y.o. No obstetric history on file. who LMP was No LMP recorded. (Menstrual status: Oral contraceptives)., presents today for her annual examination.  Her menses are absent with cont dosing of OCPs (Yaz), never taking placebo pills. No dysmen, BTB.  Sex activity: currently sexually active--contraception OCPs..  Last pap: 02/22/18 Results: no abnormalities Hx of STDs: none  There is a FH of breast cancer and ovarian cancer in her PGM, genetic testing has been done. Pt is MyRisk neg 2018. IBIS=19%/riskscore =10%. The patient does do self-breast exams.   Tobacco use: The patient denies current or previous tobacco use. Alcohol use: none No drug use.  very active  She does get adequate calcium but not Vitamin D in her diet.  Gardasil vaccine completed.   Past Medical History:  Diagnosis Date  . BRCA gene mutation negative 03/2017   MyRisk neg; IBIS=19%/riskscore=10%  . Family history of ovarian cancer    MyRisk neg; no further screening recommendations  . Vaccine for human papilloma virus (HPV) types 6, 11, 16, and 18 administered     History reviewed. No pertinent surgical history.  Family History  Problem Relation Age of Onset  . Breast cancer Paternal Grandmother 8  . Ovarian cancer Paternal Grandmother 22    Social History   Socioeconomic History  . Marital status: Single    Spouse name: Not on file  . Number of children: Not on file  . Years of education: Not on file  . Highest education level: Not on file  Occupational History  . Occupation: Medical illustrator  Tobacco Use  . Smoking status: Never Smoker  . Smokeless tobacco: Never Used  Vaping Use  . Vaping Use: Never used  Substance and Sexual Activity  . Alcohol use: No  . Drug use: No  . Sexual activity: Yes    Birth control/protection: Pill  Other Topics Concern  . Not  on file  Social History Narrative  . Not on file   Social Determinants of Health   Financial Resource Strain:   . Difficulty of Paying Living Expenses: Not on file  Food Insecurity:   . Worried About Charity fundraiser in the Last Year: Not on file  . Ran Out of Food in the Last Year: Not on file  Transportation Needs:   . Lack of Transportation (Medical): Not on file  . Lack of Transportation (Non-Medical): Not on file  Physical Activity:   . Days of Exercise per Week: Not on file  . Minutes of Exercise per Session: Not on file  Stress:   . Feeling of Stress : Not on file  Social Connections:   . Frequency of Communication with Friends and Family: Not on file  . Frequency of Social Gatherings with Friends and Family: Not on file  . Attends Religious Services: Not on file  . Active Member of Clubs or Organizations: Not on file  . Attends Archivist Meetings: Not on file  . Marital Status: Not on file  Intimate Partner Violence:   . Fear of Current or Ex-Partner: Not on file  . Emotionally Abused: Not on file  . Physically Abused: Not on file  . Sexually Abused: Not on file    Current Meds  Medication Sig  . drospirenone-ethinyl estradiol (YAZ) 3-0.02 MG tablet Take 1  tablet by mouth daily.  Marland Kitchen ketoconazole (NIZORAL) 2 % shampoo Apply topically.  . [DISCONTINUED] drospirenone-ethinyl estradiol (YAZ) 3-0.02 MG tablet Take 1 tablet by mouth daily.     ROS:  Review of Systems  Constitutional: Negative for fatigue, fever and unexpected weight change.  Respiratory: Negative for cough, shortness of breath and wheezing.   Cardiovascular: Negative for chest pain, palpitations and leg swelling.  Gastrointestinal: Negative for blood in stool, constipation, diarrhea, nausea and vomiting.  Endocrine: Negative for cold intolerance, heat intolerance and polyuria.  Genitourinary: Negative for dyspareunia, dysuria, flank pain, frequency, genital sores, hematuria, menstrual  problem, pelvic pain, urgency, vaginal bleeding, vaginal discharge and vaginal pain.  Musculoskeletal: Negative for back pain, joint swelling and myalgias.  Skin: Negative for rash.  Neurological: Negative for dizziness, syncope, light-headedness, numbness and headaches.  Hematological: Negative for adenopathy.  Psychiatric/Behavioral: Negative for agitation, confusion, sleep disturbance and suicidal ideas. The patient is not nervous/anxious.      Objective: BP 132/80   Pulse 64   Resp 16   Ht 5' 11"  (1.803 m)   Wt 286 lb 6.4 oz (129.9 kg)   SpO2 97%   BMI 39.94 kg/m    Physical Exam Constitutional:      Appearance: She is well-developed.  Genitourinary:     Vulva, vagina, uterus, right adnexa and left adnexa normal.     No vulval lesion or tenderness noted.     No vaginal discharge, erythema or tenderness.     No cervical motion tenderness or polyp.     Uterus is not enlarged or tender.     No right or left adnexal mass present.     Right adnexa not tender.     Left adnexa not tender.  Neck:     Thyroid: No thyromegaly.  Cardiovascular:     Rate and Rhythm: Normal rate and regular rhythm.     Heart sounds: Normal heart sounds. No murmur heard.   Pulmonary:     Effort: Pulmonary effort is normal.     Breath sounds: Normal breath sounds.  Chest:     Breasts:        Right: No mass, nipple discharge, skin change or tenderness.        Left: No mass, nipple discharge, skin change or tenderness.  Abdominal:     Palpations: Abdomen is soft.     Tenderness: There is no abdominal tenderness. There is no guarding.  Musculoskeletal:        General: Normal range of motion.     Cervical back: Normal range of motion.  Neurological:     General: No focal deficit present.     Mental Status: She is alert and oriented to person, place, and time.     Cranial Nerves: No cranial nerve deficit.  Skin:    General: Skin is warm and dry.  Psychiatric:        Mood and Affect: Mood  normal.        Behavior: Behavior normal.        Thought Content: Thought content normal.        Judgment: Judgment normal.  Vitals reviewed.     Assessment/Plan: Encounter for annual routine gynecological examination  Screening for STD (sexually transmitted disease) - Plan: Cervicovaginal ancillary only  Encounter for surveillance of contraceptive pills - Plan: drospirenone-ethinyl estradiol (YAZ) 3-0.02 MG tablet    Meds ordered this encounter  Medications  . drospirenone-ethinyl estradiol (YAZ) 3-0.02 MG tablet    Sig: Take  1 tablet by mouth daily.    Dispense:  84 tablet    Refill:  4    Order Specific Question:   Supervising Provider    Answer:   Gae Dry [[id number]]             GYN counsel adequate intake of calcium and vitamin D, diet and exercise     F/U  Return in about 1 year (around 03/06/2021).  Atticus Wedin B. Kynedi Profitt, PA-C 03/06/2020 1:44 PM

## 2020-03-10 LAB — CERVICOVAGINAL ANCILLARY ONLY
Chlamydia: NEGATIVE
Comment: NEGATIVE
Comment: NORMAL
Neisseria Gonorrhea: NEGATIVE

## 2020-09-11 IMAGING — CR DG CHEST 2V
1 series · 2 of 2 positions shown · non-contrast
Comparison: None.

CLINICAL DATA: [id number] positive, chest tightness

EXAM:
CHEST - 2 VIEW

[Series 1: dg chest 2 view · 0.14mm/px · 2 of 2 slices shown]
[im 1/2]
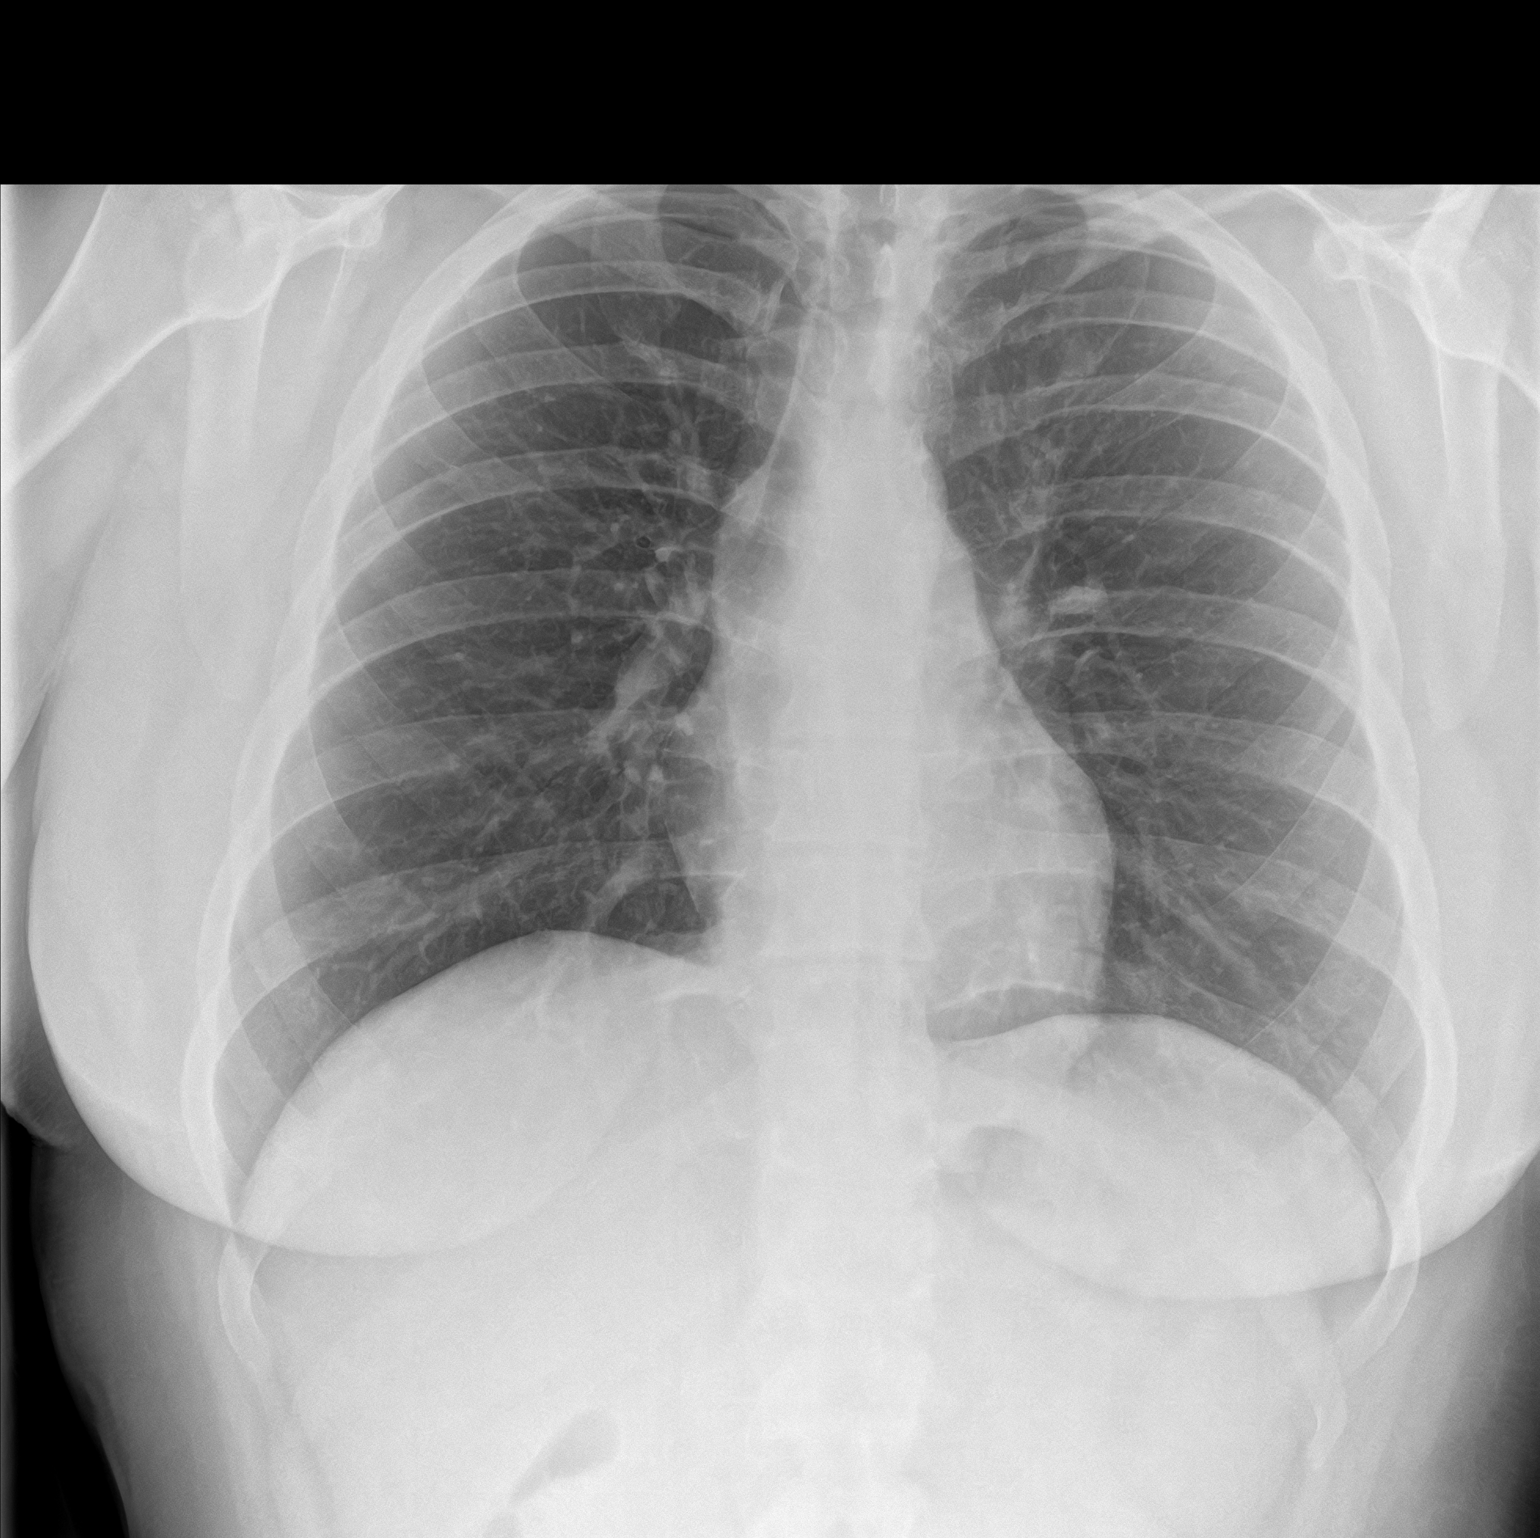
[im 2/2]
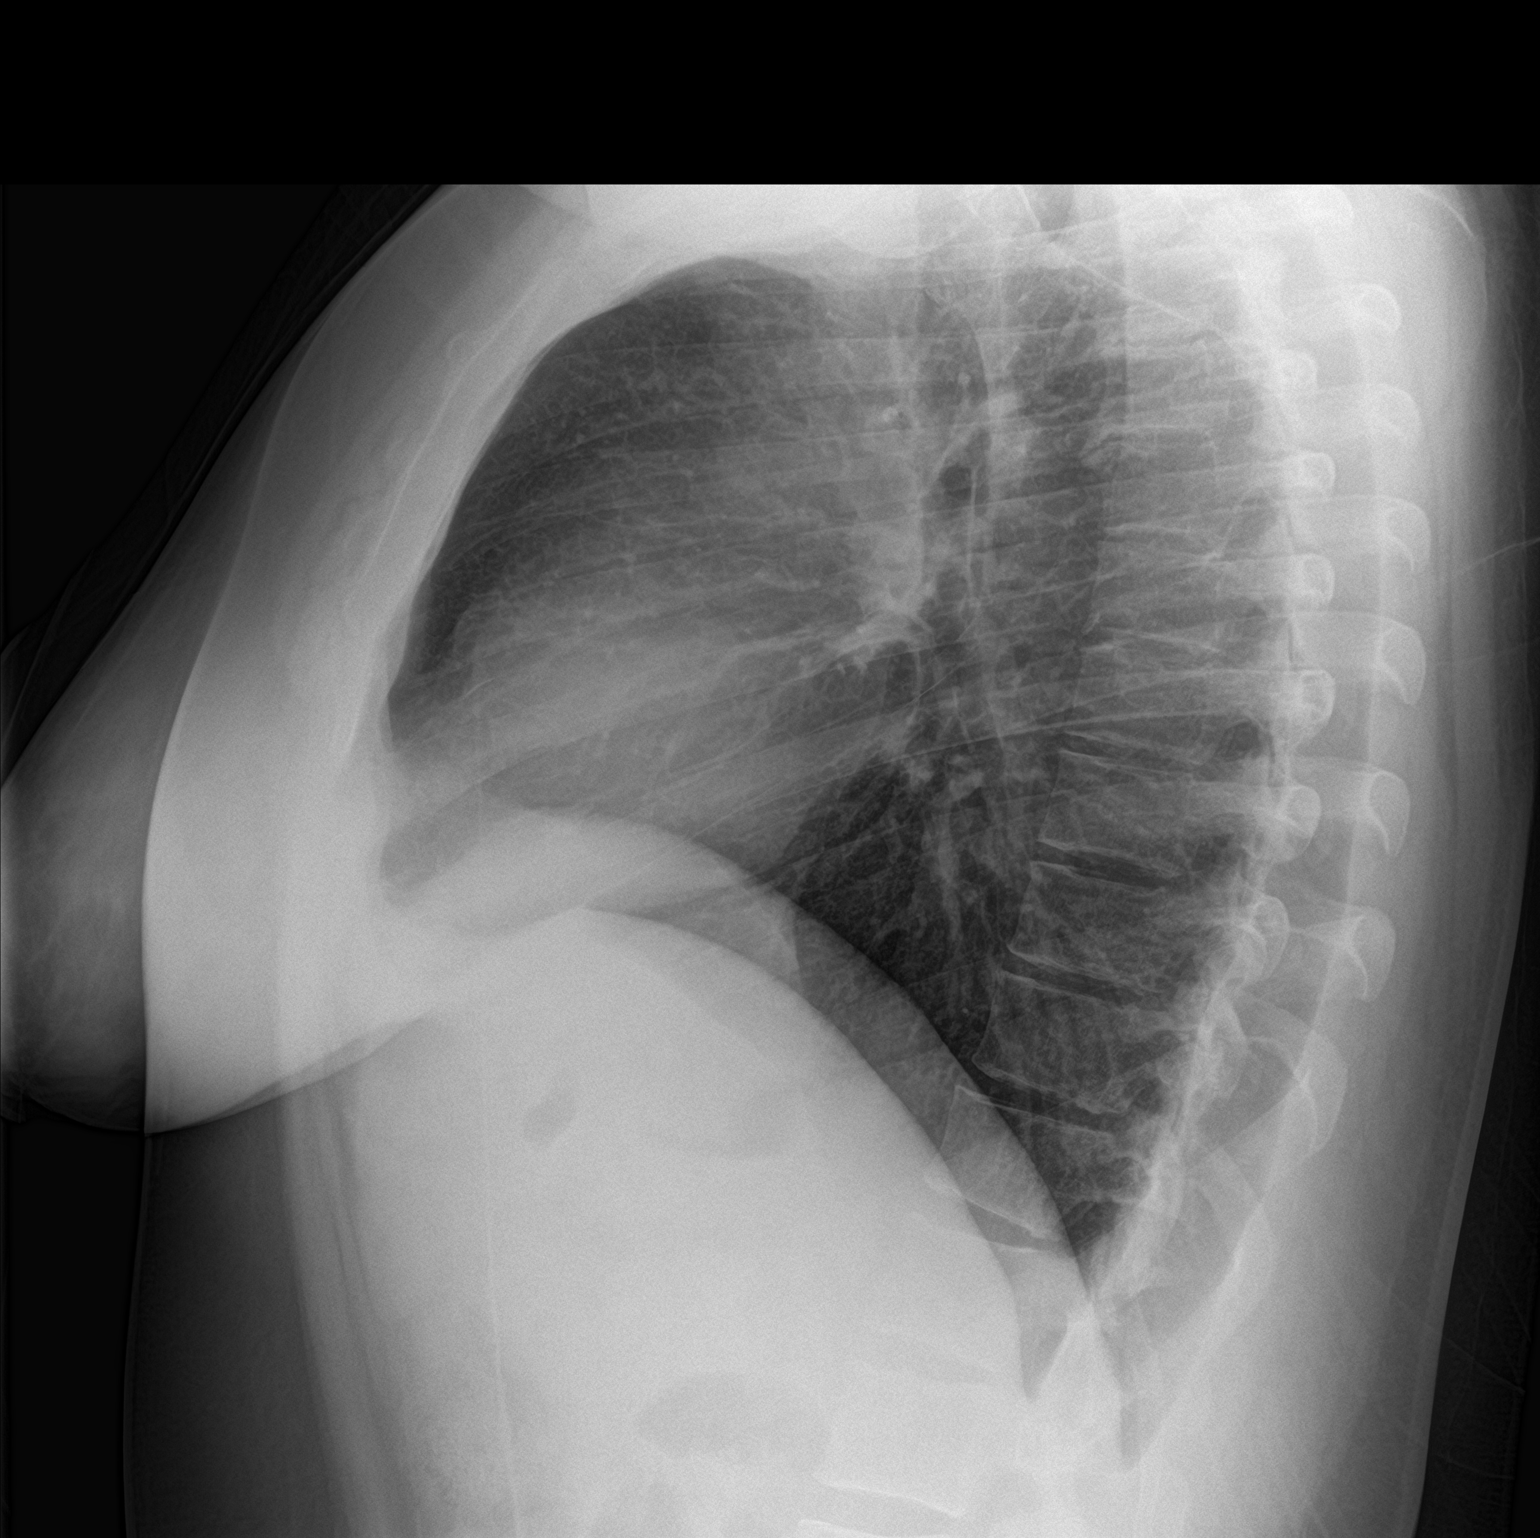

[2 of 2 positions shown; findings below may reference images not displayed]

FINDINGS: The heart size and mediastinal contours are within normal limits.
Both lungs are clear. The visualized skeletal structures are
unremarkable.
IMPRESSION: No active cardiopulmonary disease.

## 2020-12-21 IMAGING — CR DG ABDOMEN 2V
3 series · 3 of 3 positions shown · non-contrast
Comparison: None.

CLINICAL DATA: Constipation

EXAM:
ABDOMEN - 2 VIEW

[abdomen erect]
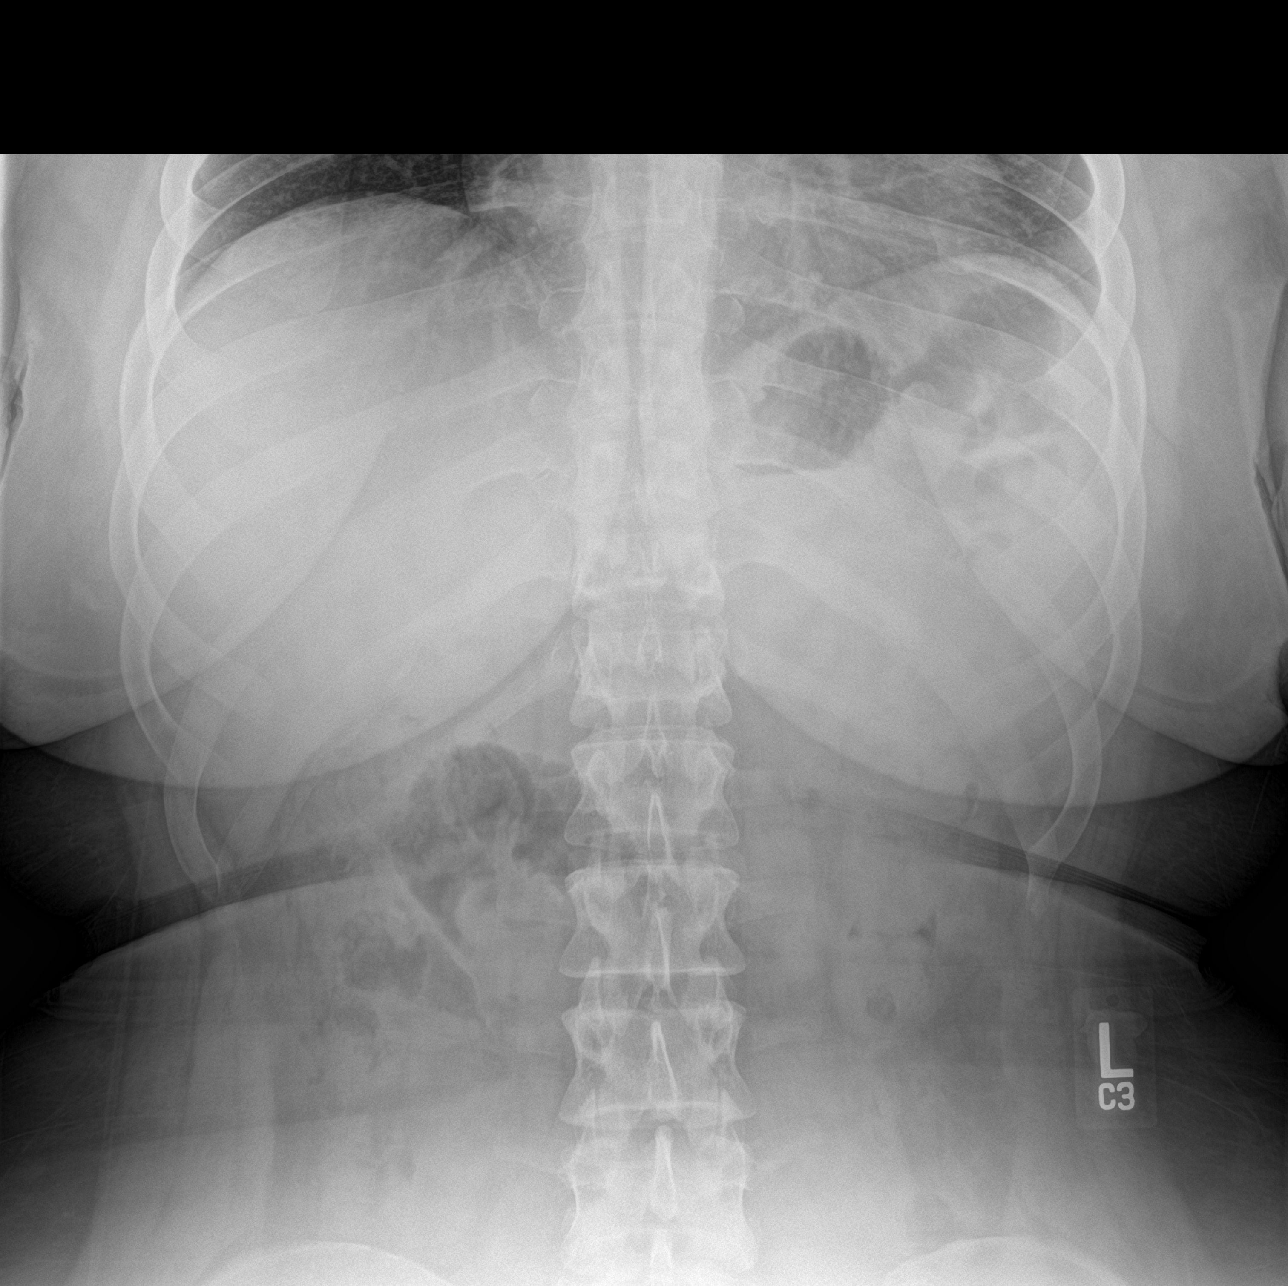

[abdomen supine (1 of 2)]
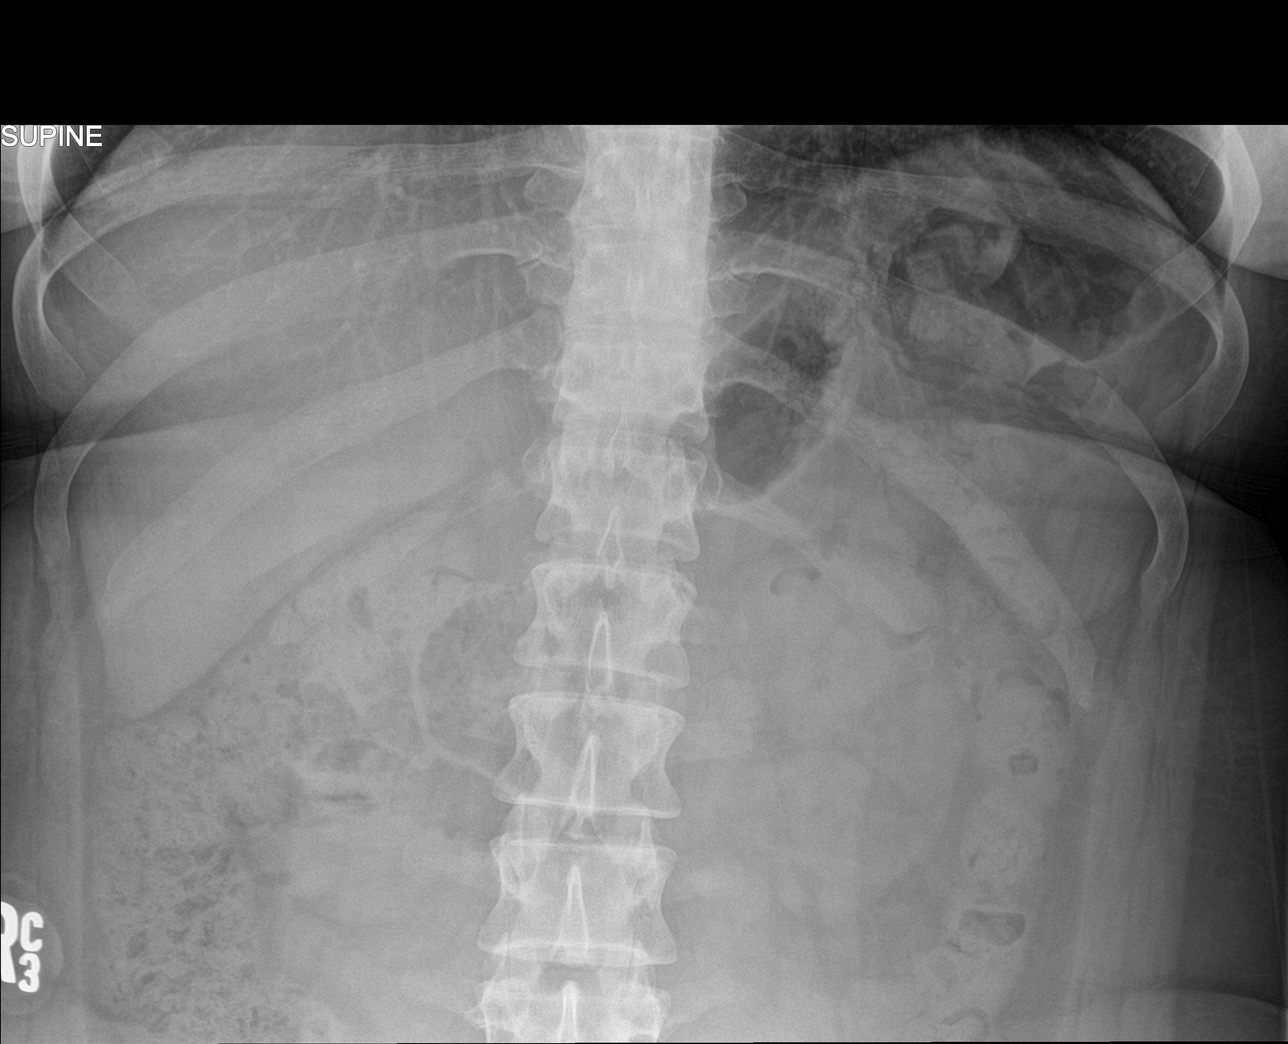

[abdomen supine (2 of 2)]
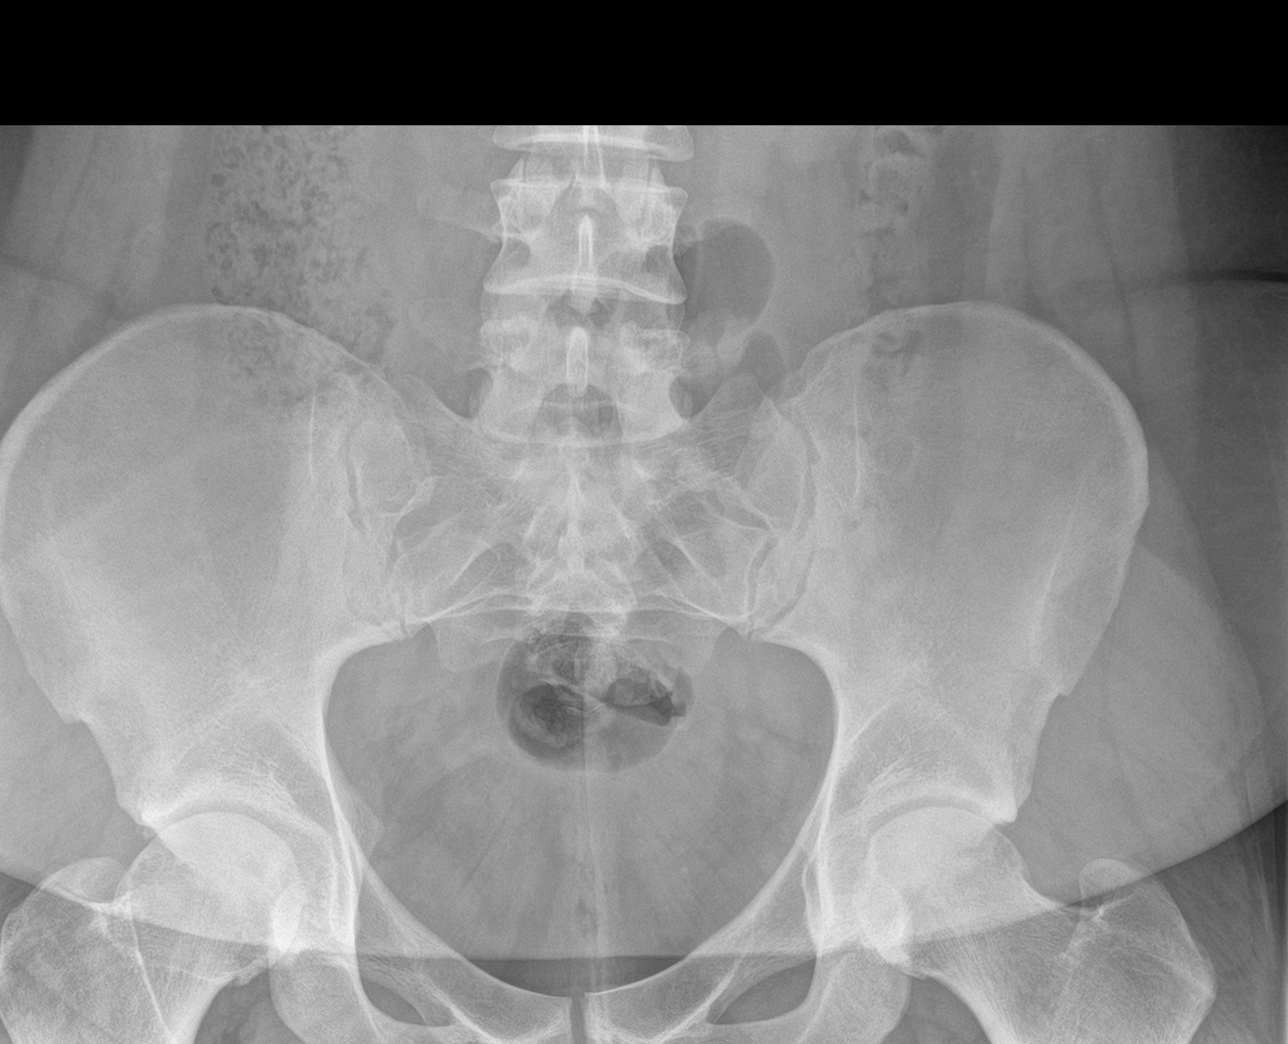

[3 of 3 positions shown; findings below may reference images not displayed]

FINDINGS: Moderate stool burden throughout the colon. There is normal bowel
gas pattern. No free air. No organomegaly or suspicious
calcification. No acute bony abnormality.
IMPRESSION: Moderate stool burden.  No acute findings.

## 2021-03-08 NOTE — Progress Notes (Signed)
PCP:  Rusty Aus, MD   Chief Complaint  Patient presents with   Gynecologic Exam    No concerns    HPI:      [name] is a 25 y.o. No obstetric history on file. who LMP was No LMP recorded. (Menstrual status: Oral contraceptives)., presents today for her annual examination.  Her menses are absent with cont dosing of OCPs (Yaz), never taking placebo pills. No dysmen, BTB.  Sex activity: currently sexually active--contraception OCPs..  Last pap: 02/22/18 Results: no abnormalities Hx of STDs: none  There is a FH of breast cancer and ovarian cancer in her PGM, genetic testing has been done. Pt is MyRisk neg 2018. IBIS=19%/riskscore =10%. The patient does do self-breast exams.   Tobacco use: The patient denies current or previous tobacco use. Alcohol use: none No drug use.  very active  She does get adequate calcium but not Vitamin D in her diet.  Gardasil vaccine completed.   Past Medical History:  Diagnosis Date   BRCA gene mutation negative 03/2017   MyRisk neg; IBIS=19%/riskscore=10%   Family history of ovarian cancer    MyRisk neg; no further screening recommendations   Vaccine for human papilloma virus (HPV) types 6, 58, 32, and 18 administered     Past Surgical History:  Procedure Laterality Date   COLONOSCOPY     WISDOM TOOTH EXTRACTION      Family History  Problem Relation Age of Onset   Breast cancer Paternal Grandmother 17   Ovarian cancer Paternal Grandmother 13    Social History   Socioeconomic History   Marital status: Single    Spouse name: Not on file   Number of children: Not on file   Years of education: Not on file   Highest education level: Not on file  Occupational History   Occupation: Medical illustrator  Tobacco Use   Smoking status: Never   Smokeless tobacco: Never  Vaping Use   Vaping Use: Never used  Substance and Sexual Activity   Alcohol use: No   Drug use: No   Sexual activity: Yes    Birth control/protection:  Pill  Other Topics Concern   Not on file  Social History Narrative   Not on file   Social Determinants of Health   Financial Resource Strain: Not on file  Food Insecurity: Not on file  Transportation Needs: Not on file  Physical Activity: Not on file  Stress: Not on file  Social Connections: Not on file  Intimate Partner Violence: Not on file    Current Meds  Medication Sig   clindamycin (CLEOCIN T) 1 % lotion Apply topically.   desonide (DESOWEN) 0.05 % ointment Apply topically 2 (two) times daily.   tretinoin (RETIN-A) 0.05 % cream Apply topically at bedtime.   [DISCONTINUED] drospirenone-ethinyl estradiol (YAZ) 3-0.02 MG tablet Take 1 tablet by mouth daily.     ROS:  Review of Systems  Constitutional:  Negative for fatigue, fever and unexpected weight change.  Respiratory:  Negative for cough, shortness of breath and wheezing.   Cardiovascular:  Negative for chest pain, palpitations and leg swelling.  Gastrointestinal:  Negative for blood in stool, constipation, diarrhea, nausea and vomiting.  Endocrine: Negative for cold intolerance, heat intolerance and polyuria.  Genitourinary:  Negative for dyspareunia, dysuria, flank pain, frequency, genital sores, hematuria, menstrual problem, pelvic pain, urgency, vaginal bleeding, vaginal discharge and vaginal pain.  Musculoskeletal:  Negative for back pain, joint swelling and myalgias.  Skin:  Negative  for rash.  Neurological:  Negative for dizziness, syncope, light-headedness, numbness and headaches.  Hematological:  Negative for adenopathy.  Psychiatric/Behavioral:  Negative for agitation, confusion, sleep disturbance and suicidal ideas. The patient is not nervous/anxious.     Objective: BP 120/70   Ht 5' 11"  (1.803 m)   Wt 259 lb (117.5 kg)   BMI 36.12 kg/m    Physical Exam Constitutional:      Appearance: She is well-developed.  Genitourinary:     Vulva normal.     Right Labia: No rash, tenderness or lesions.     Left Labia: No tenderness, lesions or rash.    No vaginal discharge, erythema or tenderness.      Right Adnexa: not tender and no mass present.    Left Adnexa: not tender and no mass present.    No cervical motion tenderness, friability or polyp.     Uterus is not enlarged or tender.  Breasts:    Right: No mass, nipple discharge, skin change or tenderness.     Left: No mass, nipple discharge, skin change or tenderness.  Neck:     Thyroid: No thyromegaly.  Cardiovascular:     Rate and Rhythm: Normal rate and regular rhythm.     Heart sounds: Normal heart sounds. No murmur heard. Pulmonary:     Effort: Pulmonary effort is normal.     Breath sounds: Normal breath sounds.  Abdominal:     Palpations: Abdomen is soft.     Tenderness: There is no abdominal tenderness. There is no guarding or rebound.  Musculoskeletal:        General: Normal range of motion.     Cervical back: Normal range of motion.  Lymphadenopathy:     Cervical: No cervical adenopathy.  Neurological:     General: No focal deficit present.     Mental Status: She is alert and oriented to person, place, and time.     Cranial Nerves: No cranial nerve deficit.  Skin:    General: Skin is warm and dry.  Psychiatric:        Mood and Affect: Mood normal.        Behavior: Behavior normal.        Thought Content: Thought content normal.        Judgment: Judgment normal.  Vitals reviewed.    Assessment/Plan: Encounter for annual routine gynecological examination  Cervical cancer screening - Plan: Cytology - PAP  Screening for STD (sexually transmitted disease) - Plan: Cytology - PAP  Encounter for surveillance of contraceptive pills - Plan: drospirenone-ethinyl estradiol (YAZ) 3-0.02 MG tablet; OCP RF cont dosing   Meds ordered this encounter  Medications   drospirenone-ethinyl estradiol (YAZ) 3-0.02 MG tablet    Sig: Take 1 tablet by mouth daily. CONTINUOUS DOSING    Dispense:  84 tablet    Refill:  4    Order  Specific Question:   Supervising Provider    Answer:   Gae Dry [[id number]]              GYN counsel adequate intake of calcium and vitamin D, diet and exercise     F/U  Return in about 1 year (around 03/09/2022).  Cynthis Purington B. Nikitia Asbill, PA-C 03/09/2021 10:24 AM

## 2021-03-09 ENCOUNTER — Ambulatory Visit (INDEPENDENT_AMBULATORY_CARE_PROVIDER_SITE_OTHER): Payer: BC Managed Care – PPO | Admitting: Obstetrics and Gynecology

## 2021-03-09 ENCOUNTER — Encounter: Payer: Self-pay | Admitting: Obstetrics and Gynecology

## 2021-03-09 ENCOUNTER — Other Ambulatory Visit (HOSPITAL_COMMUNITY)
Admission: RE | Admit: 2021-03-09 | Discharge: 2021-03-09 | Disposition: A | Discharge status: Home / Self Care | Payer: BC Managed Care – PPO | Source: Ambulatory Visit | Attending: Obstetrics and Gynecology | Admitting: Obstetrics and Gynecology

## 2021-03-09 ENCOUNTER — Other Ambulatory Visit: Payer: Self-pay

## 2021-03-09 VITALS — BP 120/70 | Ht 71.0 in | Wt 259.0 lb

## 2021-03-09 DIAGNOSIS — Z3041 Encounter for surveillance of contraceptive pills: Secondary | ICD-10-CM | POA: Diagnosis not present

## 2021-03-09 DIAGNOSIS — Z01419 Encounter for gynecological examination (general) (routine) without abnormal findings: Secondary | ICD-10-CM

## 2021-03-09 DIAGNOSIS — Z113 Encounter for screening for infections with a predominantly sexual mode of transmission: Secondary | ICD-10-CM | POA: Insufficient documentation

## 2021-03-09 DIAGNOSIS — Z124 Encounter for screening for malignant neoplasm of cervix: Secondary | ICD-10-CM

## 2021-03-09 MED ORDER — DROSPIRENONE-ETHINYL ESTRADIOL 3-0.02 MG PO TABS: 1.0000 | ORAL_TABLET | Freq: Every day | ORAL | 4 refills | Status: AC

## 2021-03-09 NOTE — Patient Instructions (Signed)
I value your feedback and you entrusting us with your care. If you get a  patient survey, I would appreciate you taking the time to let us know about your experience today. Thank you! ? ? ?

## 2021-03-12 LAB — CYTOLOGY - PAP
Chlamydia: NEGATIVE
Comment: NEGATIVE
Comment: NORMAL
Diagnosis: NEGATIVE
Neisseria Gonorrhea: NEGATIVE

## 2021-12-21 ENCOUNTER — Other Ambulatory Visit: Payer: Self-pay | Admitting: Obstetrics and Gynecology

## 2021-12-21 DIAGNOSIS — Z3041 Encounter for surveillance of contraceptive pills: Secondary | ICD-10-CM

## 2022-02-04 ENCOUNTER — Ambulatory Visit: Payer: BC Managed Care – PPO | Admitting: Obstetrics and Gynecology

## 2022-03-11 ENCOUNTER — Ambulatory Visit: Payer: BC Managed Care – PPO | Admitting: Obstetrics and Gynecology
